# Patient Record
Sex: Male | Born: 1990 | Race: Black or African American | Hispanic: No | Marital: Single | Smoking: Current every day smoker
Health system: Southern US, Community
[De-identification: ages and names within clinical notes are randomized; demographics above are authoritative.]

## PROBLEM LIST (undated history)

## (undated) DIAGNOSIS — F209 Schizophrenia, unspecified: Secondary | ICD-10-CM

---

## 2011-12-20 ENCOUNTER — Emergency Department (HOSPITAL_BASED_OUTPATIENT_CLINIC_OR_DEPARTMENT_OTHER): Payer: PRIVATE HEALTH INSURANCE

## 2011-12-20 ENCOUNTER — Encounter (HOSPITAL_BASED_OUTPATIENT_CLINIC_OR_DEPARTMENT_OTHER): Payer: Self-pay | Admitting: Emergency Medicine

## 2011-12-20 ENCOUNTER — Emergency Department (HOSPITAL_BASED_OUTPATIENT_CLINIC_OR_DEPARTMENT_OTHER)
Admission: EM | Admit: 2011-12-20 | Discharge: 2011-12-20 | Disposition: A | Discharge status: Home / Self Care | Payer: PRIVATE HEALTH INSURANCE | Attending: Emergency Medicine | Admitting: Emergency Medicine

## 2011-12-20 DIAGNOSIS — M542 Cervicalgia: Secondary | ICD-10-CM

## 2011-12-20 DIAGNOSIS — M25519 Pain in unspecified shoulder: Secondary | ICD-10-CM

## 2011-12-20 MED ORDER — IBUPROFEN 800 MG PO TABS: 800.0000 mg | ORAL_TABLET | Freq: Three times a day (TID) | ORAL | Status: AC

## 2011-12-20 MED ORDER — HYDROCODONE-ACETAMINOPHEN 5-325 MG PO TABS: 2.0000 | ORAL_TABLET | ORAL | Status: AC | PRN

## 2011-12-20 NOTE — ED Notes (Signed)
Pt c/o LT side neck pain, radiating to LT shoulder x 2 wks; no known injury

## 2011-12-20 NOTE — ED Provider Notes (Signed)
Medical screening examination/treatment/procedure(s) were performed by non-physician practitioner and as supervising physician I was immediately available for consultation/collaboration.   Hiran Leard, MD 12/20/11 1836 

## 2011-12-20 NOTE — ED Provider Notes (Signed)
History     CSN: [id number]  Arrival date & time 12/20/11  1434   First MD Initiated Contact with Patient 12/20/11 1517      Chief Complaint  Patient presents with  . Neck Pain    (Consider location/radiation/quality/duration/timing/severity/associated sxs/prior treatment) Patient is a 21 y.o. male presenting with shoulder pain. The history is provided by the patient. No language interpreter was used.  Shoulder Pain This is a new problem. The problem occurs constantly. The problem has been gradually worsening. Pertinent negatives include no joint swelling or myalgias. Nothing aggravates the symptoms. He has tried nothing for the symptoms. The treatment provided moderate relief.  Pt complains of pain in his left shoulder and his neck for 2 weeks.  Pt reports no one injury.  Pt plays football for [employer] and has been having pain for 2 weeks.  History reviewed. No pertinent past medical history.  History reviewed. No pertinent past surgical history.  No family history on file.  History  Substance Use Topics  . Smoking status: Current Some Day Smoker    Types: Cigars  . Smokeless tobacco: Not on file  . Alcohol Use: Yes     social      Review of Systems  Musculoskeletal: Negative for myalgias and joint swelling.  Skin: Negative for wound.  All other systems reviewed and are negative.    Allergies  Review of patient's allergies indicates no known allergies.  Home Medications   Current Outpatient Rx  Name Route Sig Dispense Refill  . ACETAMINOPHEN 325 MG PO TABS Oral Take 650 mg by mouth every 6 (six) hours as needed. For pain.    . IBUPROFEN 200 MG PO TABS Oral Take 200 mg by mouth every 6 (six) hours as needed. For pain.      BP 140/71  Pulse 51  Temp 97.5 F (36.4 C) (Oral)  Resp 16  Ht 6\' 2"  (1.88 m)  Wt 235 lb (106.595 kg)  BMI 30.17 kg/m2  SpO2 100%  Physical Exam  Nursing note and vitals reviewed. Constitutional: He is oriented to person, place, and  time. He appears well-developed and well-nourished.  HENT:  Head: Normocephalic and atraumatic.  Eyes: Pupils are equal, round, and reactive to light.  Neck: Normal range of motion.  Cardiovascular: Normal rate.   Pulmonary/Chest: Effort normal.  Abdominal: Soft.  Musculoskeletal: He exhibits tenderness.       Tender left shoulder, ac joint tender   Neurological: He is alert and oriented to person, place, and time. He has normal reflexes.  Skin: Skin is warm.  Psychiatric: He has a normal mood and affect.    ED Course  Procedures (including critical care time)  Labs Reviewed - No data to display Dg Cervical Spine Complete  12/20/2011  *RADIOLOGY REPORT*  Clinical Data: Neck pain after recent football practice.  CERVICAL SPINE - COMPLETE 4+ VIEW  Comparison: None.  Findings: The lateral view images through bottom of T1. Prevertebral soft tissues are within normal limits.  Mild straightening of expected cervical lordosis.  Maintenance of vertebral body heights.  Facets are well-aligned.  Odontoid process partially obscured.  Superior portion of lateral masses also partially obscured.  C1-C2 otherwise within normal limits.  IMPRESSION:  1.  Mildly degraded evaluation of C1-C2.  Otherwise, no acute fracture or subluxation. 2. Straightening of expected cervical lordosis could be positional, due to muscular spasm, or ligamentous injury.   Original Report Authenticated By: Consuello Bossier, M.D.    Dg  Shoulder Left  12/20/2011  *RADIOLOGY REPORT*  Clinical Data: Pain after football practice.  LEFT SHOULDER - 2+ VIEW  Comparison: Cervical spine same date  Findings: No acute fracture or dislocation.  Visualized portion of the left hemithorax is normal.  The distal clavicle is minimally superiorly positioned relative to the acromion.  IMPRESSION: Minimal malalignment/displacement of distal clavicle superior to the acromion.  Cannot exclude acromioclavicular joint separation. Correlate with point  tenderness.   Original Report Authenticated By: Consuello Bossier, M.D.      1. Shoulder pain   2. Neck pain       MDM  rx for ibuprofen and hydrocodone,  Sling.   See the Orthopaedist on Tuesday for evaluation        Lonia Skinner [place], [place] 12/20/11 [id number]

## 2011-12-23 ENCOUNTER — Other Ambulatory Visit: Payer: Self-pay | Admitting: Family Medicine

## 2011-12-23 ENCOUNTER — Ambulatory Visit
Admission: RE | Admit: 2011-12-23 | Discharge: 2011-12-23 | Disposition: A | Discharge status: Home / Self Care | Payer: PRIVATE HEALTH INSURANCE | Source: Ambulatory Visit | Attending: Family Medicine | Admitting: Family Medicine

## 2011-12-23 DIAGNOSIS — M542 Cervicalgia: Secondary | ICD-10-CM

## 2013-03-07 ENCOUNTER — Other Ambulatory Visit: Payer: Self-pay | Admitting: Orthopedic Surgery

## 2013-03-07 ENCOUNTER — Ambulatory Visit
Admission: RE | Admit: 2013-03-07 | Discharge: 2013-03-07 | Disposition: A | Discharge status: Home / Self Care | Payer: PRIVATE HEALTH INSURANCE | Source: Ambulatory Visit | Attending: Orthopedic Surgery | Admitting: Orthopedic Surgery

## 2013-03-07 DIAGNOSIS — T148XXA Other injury of unspecified body region, initial encounter: Secondary | ICD-10-CM

## 2016-11-24 ENCOUNTER — Emergency Department (HOSPITAL_COMMUNITY)
Admission: EM | Admit: 2016-11-24 | Discharge: 2016-11-25 | Disposition: A | Discharge status: Home / Self Care | Payer: PRIVATE HEALTH INSURANCE | Attending: Emergency Medicine | Admitting: Emergency Medicine

## 2016-11-24 ENCOUNTER — Encounter (HOSPITAL_COMMUNITY): Payer: Self-pay | Admitting: Emergency Medicine

## 2016-11-24 DIAGNOSIS — Z046 Encounter for general psychiatric examination, requested by authority: Secondary | ICD-10-CM | POA: Diagnosis present

## 2016-11-24 DIAGNOSIS — R443 Hallucinations, unspecified: Secondary | ICD-10-CM | POA: Diagnosis not present

## 2016-11-24 DIAGNOSIS — F1099 Alcohol use, unspecified with unspecified alcohol-induced disorder: Secondary | ICD-10-CM | POA: Diagnosis not present

## 2016-11-24 DIAGNOSIS — F22 Delusional disorders: Secondary | ICD-10-CM | POA: Diagnosis present

## 2016-11-24 DIAGNOSIS — F17210 Nicotine dependence, cigarettes, uncomplicated: Secondary | ICD-10-CM | POA: Diagnosis not present

## 2016-11-24 DIAGNOSIS — R440 Auditory hallucinations: Secondary | ICD-10-CM | POA: Insufficient documentation

## 2016-11-24 LAB — CBC WITH DIFFERENTIAL/PLATELET
BASOS PCT: 0 %
Basophils Absolute: 0 10*3/uL (ref 0.0–0.1)
EOS ABS: 0 10*3/uL (ref 0.0–0.7)
Eosinophils Relative: 0 %
HEMATOCRIT: 45.3 % (ref 39.0–52.0)
Hemoglobin: 15.2 g/dL (ref 13.0–17.0)
Lymphocytes Relative: 16 %
Lymphs Abs: 1.5 10*3/uL (ref 0.7–4.0)
MCH: 28.4 pg (ref 26.0–34.0)
MCHC: 33.6 g/dL (ref 30.0–36.0)
MCV: 84.5 fL (ref 78.0–100.0)
MONO ABS: 0.8 10*3/uL (ref 0.1–1.0)
MONOS PCT: 8 %
NEUTROS ABS: 7.3 10*3/uL (ref 1.7–7.7)
Neutrophils Relative %: 76 %
Platelets: 277 10*3/uL (ref 150–400)
RBC: 5.36 MIL/uL (ref 4.22–5.81)
RDW: 15.2 % (ref 11.5–15.5)
WBC: 9.6 10*3/uL (ref 4.0–10.5)

## 2016-11-24 LAB — COMPREHENSIVE METABOLIC PANEL
ALT: 20 U/L (ref 17–63)
ANION GAP: 9 (ref 5–15)
AST: 25 U/L (ref 15–41)
Albumin: 4.5 g/dL (ref 3.5–5.0)
Alkaline Phosphatase: 68 U/L (ref 38–126)
BUN: 12 mg/dL (ref 6–20)
CO2: 26 mmol/L (ref 22–32)
Calcium: 9.7 mg/dL (ref 8.9–10.3)
Chloride: 109 mmol/L (ref 101–111)
Creatinine, Ser: 1.36 mg/dL — ABNORMAL HIGH (ref 0.61–1.24)
GFR calc non Af Amer: >60 mL/min (ref >60)
Glucose, Bld: 91 mg/dL (ref 65–99)
POTASSIUM: 3.9 mmol/L (ref 3.5–5.1)
SODIUM: 144 mmol/L (ref 135–145)
Total Bilirubin: 0.7 mg/dL (ref 0.3–1.2)
Total Protein: 8 g/dL (ref 6.5–8.1)

## 2016-11-24 LAB — ACETAMINOPHEN LEVEL: Acetaminophen (Tylenol), Serum: <10 ug/mL — ABNORMAL LOW (ref 10–30)

## 2016-11-24 LAB — RAPID URINE DRUG SCREEN, HOSP PERFORMED
AMPHETAMINES: NOT DETECTED
Barbiturates: NOT DETECTED
Benzodiazepines: NOT DETECTED
COCAINE: NOT DETECTED
OPIATES: NOT DETECTED
TETRAHYDROCANNABINOL: NOT DETECTED

## 2016-11-24 LAB — ETHANOL: Alcohol, Ethyl (B): <5 mg/dL (ref <5)

## 2016-11-24 LAB — SALICYLATE LEVEL

## 2016-11-24 MED ORDER — HYDROXYZINE HCL 25 MG PO TABS
25.0000 mg | ORAL_TABLET | Freq: Two times a day (BID) | ORAL | Status: DC
  Administered 2016-11-24: 25 mg via ORAL
  Filled 2016-11-24 (×3): qty 1

## 2016-11-24 MED ORDER — HALOPERIDOL 2 MG PO TABS
2.0000 mg | ORAL_TABLET | Freq: Two times a day (BID) | ORAL | Status: DC
Start: 2016-11-24 — End: 2016-11-25
  Administered 2016-11-24: 2 mg via ORAL
  Filled 2016-11-24 (×3): qty 1

## 2016-11-24 NOTE — ED Provider Notes (Signed)
[facility]-EMERGENCY DEPT Provider Note   CSN: [id number] Arrival date & time: 11/24/16  [id number]     History   Chief Complaint Chief Complaint  Patient presents with  . IVC    HPI Corey Burton is a 26 y.o. male.  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 He told his relative's car to drive to [place] to try out for the [employer]. Supposedly he has been taken away several times a way by security at [place] [employer].      The history is provided by the patient.  Altered Mental Status   This is a recurrent problem. The current episode started more than 2 days ago. The problem has not changed since onset.Associated symptoms include confusion. Pertinent negatives include no seizures and no hallucinations. Risk factors: unknown. His past medical history does not include seizures.    History reviewed. No pertinent past medical history.  Patient Active Problem List   Diagnosis Date Noted  . 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 No pertinent surgical history.     Home Medications    Prior to Admission medications   Not on File    Family History History reviewed. No pertinent family history.  Social History Social History  Substance Use Topics  . Smoking status: Current Some Day Smoker    Types: Cigars  . Smokeless tobacco: Never Used  . Alcohol use Yes     Comment: social     Allergies   Patient has no known allergies.   Review of Systems Review of Systems  Constitutional: Negative for appetite change and fatigue.  HENT: Negative for congestion, ear discharge and sinus pressure.   Eyes: Negative for discharge.  Respiratory: Negative for cough.   Cardiovascular: Negative for chest pain.  Gastrointestinal: Negative for abdominal pain and diarrhea.  Genitourinary: Negative for frequency and hematuria.  Musculoskeletal: Negative for back pain.    Skin: Negative for rash.  Neurological: Negative for seizures and headaches.  Psychiatric/Behavioral: Positive for confusion. Negative for hallucinations.     Physical Exam Updated Vital Signs BP (!) 157/86 (BP Location: Right Arm)   Pulse 95   Temp 98.3 F (36.8 C) (Oral)   Resp 18   Ht 6\' 1"  (1.854 m)   Wt 108.9 kg (240 lb)   SpO2 98%   BMI 31.66 kg/m   Physical Exam  Constitutional: He is oriented to person, place, and time. He appears well-developed.  HENT:  Head: Normocephalic.  Eyes: Conjunctivae and EOM are normal. No scleral icterus.  Neck: Neck supple. No thyromegaly present.  Cardiovascular: Normal rate and regular rhythm.  Exam reveals no gallop and no friction rub.   No murmur heard. Pulmonary/Chest: No stridor. He has no wheezes. He has no rales. He exhibits no tenderness.  Abdominal: He exhibits no distension. There is no tenderness. There is no rebound.  Musculoskeletal: Normal range of motion. He exhibits no edema.  Lymphadenopathy:    He has no cervical adenopathy.  Neurological: He is oriented to person, place, and time. He exhibits normal muscle tone. Coordination normal.  Skin: No rash noted. No erythema.  Psychiatric:  Patient is not suicidal and not homicidal but has delusions that he hasn't had a palpable player     ED Treatments / Results  Labs (all labs ordered are listed, but only abnormal results are displayed) Labs Reviewed  COMPREHENSIVE METABOLIC PANEL - Abnormal; Notable for  the following:       Result Value   Creatinine, Ser 1.36 (*)    All other components within normal limits  ACETAMINOPHEN LEVEL - Abnormal; Notable for the following:    Acetaminophen (Tylenol), Serum <10 (*)    All other components within normal limits  ETHANOL  RAPID URINE DRUG SCREEN, HOSP PERFORMED  CBC WITH DIFFERENTIAL/PLATELET  SALICYLATE LEVEL    EKG  EKG Interpretation None       Radiology No results found.  Procedures Procedures  (including critical care time)  Medications Ordered in ED Medications  haloperidol (HALDOL) tablet 2 mg (2 mg Oral Given 11/24/16 1125)  hydrOXYzine (ATARAX/VISTARIL) tablet 25 mg (25 mg Oral Given 11/24/16 1125)     Initial Impression / Assessment and Plan / ED Course  I have reviewed the triage vital signs and the nursing notes.  Pertinent labs & imaging results that were available during my care of the patient were reviewed by me and considered in my medical decision making (see chart for details).     Patient seen by behavioral health and they will reevaluate tomorrow Final Clinical Impressions(s) / ED Diagnoses   Final diagnoses:  Delusions [facility])    New Prescriptions New Prescriptions   No medications on file     Bethann Berkshire, MD 11/24/16 1401

## 2016-11-24 NOTE — Progress Notes (Signed)
Pt ambulatory to [facility] with a steady gait. Presents animated but anxious on initial contact. Denies SI, HI, AVH and pain when assessed. Per pt "I just need to leave, I think my father is jealous of me because I'm doing something for myself, I'm coming into some money, the [employer] are asking me to play for them". "I don't want to stay with my father becau Per pt's father during visitation; pt bought a [employer] bus pass to [place] and left home approximately 0200 this AM to attend [employer] training camp. Pt's father also stated that he took father's vehicle without permission and drove to [place] to attend [employer] training [place] and was hospitalized in [place] last month. Unit routines and IVC status explained to pt but to no avail as pt continues to demand d/c asap. Support and encouragement provided to pt. Fluids and snacks offered, tolerated well. Routine safety checks remains effective without self harm gestures or outburst to note thus far.

## 2016-11-24 NOTE — BH Assessment (Addendum)
[facility] Assessment Progress Note  Case was staffed with Akintayo MD who recommended patient be re-evaluated in the a.m.      
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
## 2016-11-24 NOTE — ED Notes (Signed)
Bed: [facility]36 Expected date:  Expected time:  Means of arrival:  Comments: Hold for hall c 

## 2016-11-24 NOTE — ED Notes (Signed)
Patient is resting quietly with eyes closed. Respirations are equal and unlabored. Skin is warm and dry. NAD. Will continue to monitor patient.

## 2016-11-24 NOTE — ED Notes (Signed)
Patient is resting comfortably. 
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
## 2016-11-24 NOTE — ED Notes (Signed)
Pt has been seen and wand by security.   Pt belongings are in cabinet assigned to bed.

## 2016-11-24 NOTE — ED Notes (Signed)
Bed: WTR5 Expected date:  Expected time:  Means of arrival:  Comments: 

## 2016-11-24 NOTE — ED Notes (Signed)
Patient eating meal at this time. Patient is calm and cooperative at this time. Patient denies SI,HI and AVH at this time. Plan of care discussed with patient. Patient voices no complaints or concerns at this time. Encouragement and support provided and safety maintain. Q 15 min safety checks remain in place and video monitoring.

## 2016-11-24 NOTE — ED Notes (Signed)
[facility] Expected date:  Expected time:  Means of arrival:  Comments: Tr 5

## 2016-11-24 NOTE — ED Notes (Signed)
Patient's aunt at the bedside. Patient calm.

## 2016-11-25 DIAGNOSIS — R443 Hallucinations, unspecified: Secondary | ICD-10-CM | POA: Diagnosis not present

## 2016-11-25 DIAGNOSIS — F1099 Alcohol use, unspecified with unspecified alcohol-induced disorder: Secondary | ICD-10-CM | POA: Diagnosis not present

## 2016-11-25 DIAGNOSIS — F22 Delusional disorders: Secondary | ICD-10-CM

## 2016-11-25 DIAGNOSIS — F17210 Nicotine dependence, cigarettes, uncomplicated: Secondary | ICD-10-CM

## 2016-11-25 NOTE — Consult Note (Signed)
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 male patient admitted with delusional thinking.  HPI:  Pt was seen by this clinician and Dr Darleene Cleaver. Pt is under IVC, placed by his father, for having fixed delusional thoughts that he is going to play football for the [employer]. Today, Pt is calm and cooperative, alert & oriented x 4, and appropriate in his demeanor. Pt denies  suicidal/homicidal ideations, denies auditory and visual hallucinations and does not appear to be responding to internal stimuli. Pt is stable and psychiatrically cleared for discharge.   Past Psychiatric History: No previous diagnoses on chart  Risk to Self: None Risk to Others: None Prior Inpatient Therapy: Prior Inpatient Therapy: No Prior Therapy Dates: NA Prior Therapy Facilty/Provider(s): NA Reason for Treatment: NA Prior Outpatient Therapy: Prior Outpatient Therapy: No Prior Therapy Dates: NA Prior Therapy Facilty/Provider(s): NA Reason for Treatment: NA Does patient have an ACCT team?: No Does patient have Intensive In-House Services?  : No Does patient have [facility] services? : No Does patient have P4CC services?: No  Past Medical History: History reviewed. No pertinent past medical history. History reviewed. No pertinent surgical history. Family History: History reviewed. No pertinent family history. Family Psychiatric  History: Unknown Social History:  History  Alcohol Use  . Yes    Comment: social     History  Drug Use No    Social History   Social History  . Marital status: Single    Spouse name: N/A  .  Number of children: N/A  . Years of education: N/A   Social History Main Topics  . Smoking status: Current Some Day Smoker    Types: Cigars  . Smokeless tobacco: Never Used  . Alcohol use Yes     Comment: social  . Drug use: No  . Sexual activity: Not Asked   Other Topics Concern  . None   Social History Narrative  . None   Additional Social History:    Allergies:  No Known Allergies  Labs:  Results for orders placed or performed during the hospital encounter of 11/24/16 (from the past 48 hour(s))  Comprehensive metabolic panel     Status: Abnormal   Collection Time: 11/24/16  8:11 AM  Result Value Ref Range   Sodium 144 135 - 145 mmol/L   Potassium 3.9 3.5 - 5.1 mmol/L   Chloride 109 101 - 111 mmol/L   CO2 26 22 - 32 mmol/L   Glucose, Bld 91 65 - 99 mg/dL   BUN 12 6 - 20 mg/dL   Creatinine, Ser 1.36 (H) 0.61 - 1.24 mg/dL   Calcium 9.7 8.9 - 10.3 mg/dL   Total Protein 8.0 6.5 - 8.1 g/dL   Albumin 4.5 3.5 - 5.0 g/dL   AST 25 15 - 41 U/L   ALT 20 17 - 63 U/L   Alkaline Phosphatase 68 38 - 126 U/L   Total Bilirubin 0.7 0.3 - 1.2 mg/dL   GFR calc non Af Amer >60 >60 mL/min   GFR calc Af Amer >60 >60 mL/min    Comment: (NOTE) The eGFR has been calculated using the CKD EPI equation.  This calculation has not been validated in all clinical situations. eGFR's persistently <60 mL/min signify possible Chronic Kidney Disease.    Anion gap 9 5 - 15  CBC with Diff     Status: None   Collection Time: 11/24/16  8:11 AM  Result Value Ref Range   WBC 9.6 4.0 - 10.5 K/uL   RBC 5.36 4.22 - 5.81 MIL/uL   Hemoglobin 15.2 13.0 - 17.0 g/dL   HCT 45.3 39.0 - 52.0 %   MCV 84.5 78.0 - 100.0 fL   MCH 28.4 26.0 - 34.0 pg   MCHC 33.6 30.0 - 36.0 g/dL   RDW 15.2 11.5 - 15.5 %   Platelets 277 150 - 400 K/uL   Neutrophils Relative % 76 %   Neutro Abs 7.3 1.7 - 7.7 K/uL   Lymphocytes Relative 16 %   Lymphs Abs 1.5 0.7 - 4.0 K/uL   Monocytes Relative 8 %   Monocytes Absolute 0.8 0.1  - 1.0 K/uL   Eosinophils Relative 0 %   Eosinophils Absolute 0.0 0.0 - 0.7 K/uL   Basophils Relative 0 %   Basophils Absolute 0.0 0.0 - 0.1 K/uL  Ethanol     Status: None   Collection Time: 11/24/16  8:12 AM  Result Value Ref Range   Alcohol, Ethyl (B) <5 <5 mg/dL    Comment:        LOWEST DETECTABLE LIMIT FOR SERUM ALCOHOL IS 5 mg/dL FOR MEDICAL PURPOSES ONLY   Acetaminophen level     Status: Abnormal   Collection Time: 11/24/16  8:12 AM  Result Value Ref Range   Acetaminophen (Tylenol), Serum <10 (L) 10 - 30 ug/mL    Comment:        THERAPEUTIC CONCENTRATIONS VARY SIGNIFICANTLY. A RANGE OF 10-30 ug/mL MAY BE AN EFFECTIVE CONCENTRATION FOR MANY PATIENTS. HOWEVER, SOME ARE BEST TREATED AT CONCENTRATIONS OUTSIDE THIS RANGE. ACETAMINOPHEN CONCENTRATIONS >150 ug/mL AT 4 HOURS AFTER INGESTION AND >50 ug/mL AT 12 HOURS AFTER INGESTION ARE OFTEN ASSOCIATED WITH TOXIC REACTIONS.   Salicylate level     Status: None   Collection Time: 11/24/16  8:12 AM  Result Value Ref Range   Salicylate Lvl <2.6 2.8 - 30.0 mg/dL  Urine rapid drug screen (hosp performed)     Status: None   Collection Time: 11/24/16  8:14 AM  Result Value Ref Range   Opiates NONE DETECTED NONE DETECTED   Cocaine NONE DETECTED NONE DETECTED   Benzodiazepines NONE DETECTED NONE DETECTED   Amphetamines NONE DETECTED NONE DETECTED   Tetrahydrocannabinol NONE DETECTED NONE DETECTED   Barbiturates NONE DETECTED NONE DETECTED    Comment:        DRUG SCREEN FOR MEDICAL PURPOSES ONLY.  IF CONFIRMATION IS NEEDED FOR ANY PURPOSE, NOTIFY LAB WITHIN 5 DAYS.        LOWEST DETECTABLE LIMITS FOR URINE DRUG SCREEN Drug Class       Cutoff (ng/mL) Amphetamine      1000 Barbiturate      200 Benzodiazepine   948 Tricyclics       546 Opiates          300 Cocaine          300 THC              50     Current Facility-Administered Medications  Medication Dose Route Frequency Provider Last Rate Last Dose  .  haloperidol (HALDOL) tablet 2 mg  2 mg Oral BID Corena Pilgrim, MD  Stopped at 11/24/16 2316  . hydrOXYzine (ATARAX/VISTARIL) tablet 25 mg  25 mg Oral BID Corena Pilgrim, MD   Stopped at 11/24/16 2316   No current outpatient prescriptions on file.    Musculoskeletal: Strength & Muscle Tone: within normal limits Gait & Station: normal Patient leans: N/A  Psychiatric Specialty Exam: Physical Exam  Constitutional: He is oriented to person, place, and time. He appears well-developed and well-nourished.  Respiratory: Effort normal.  Musculoskeletal: Normal range of motion.  Neurological: He is alert and oriented to person, place, and time.  Psychiatric: He has a normal mood and affect. His speech is normal and behavior is normal. Judgment normal. Thought content is delusional. Cognition and memory are normal.    Review of Systems  Psychiatric/Behavioral: Positive for hallucinations (Delusional disorder; grandiose type).  All other systems reviewed and are negative.   Blood pressure 124/75, pulse 60, temperature 98 F (36.7 C), temperature source Oral, resp. rate 20, height '6\' 1"'  (1.854 m), weight 108.9 kg (240 lb), SpO2 100 %.Body mass index is 31.66 kg/m.  General Appearance: Casual  Eye Contact:  Good  Speech:  Clear and Coherent and Normal Rate  Volume:  Normal  Mood:  Euthymic  Affect:  Congruent  Thought Process:  Coherent, Goal Directed and Linear  Orientation:  Full (Time, Place, and Person)  Thought Content:  Delusions  Suicidal Thoughts:  No  Homicidal Thoughts:  No  Memory:  Immediate;   Good Recent;   Fair Remote;   Fair  Judgement:  Fair  Insight:  Fair  Psychomotor Activity:  Normal  Concentration:  Concentration: Good and Attention Span: Good  Recall:  Good  Fund of Knowledge:  Good  Language:  Good  Akathisia:  No  Handed:  Right  AIMS (if indicated):     Assets:  Agricultural consultant Housing Physical  Health Resilience Social Support Transportation Vocational/Educational  ADL's:  Intact  Cognition:  WNL  Sleep:        Treatment Plan Summary: Plan Delusional Disorder: Grandiose type  Discharge Home Follow up with PCP for any new or current medical concerns Pt declined outpatient resources offered to him  Disposition: Patient does not meet criteria for psychiatric inpatient admission. Supportive therapy provided about ongoing stressors.  Ethelene Hal, NP 11/25/2016 10:58 AM  Patient seen face-to-face for psychiatric evaluation, chart reviewed and case discussed with the physician extender and developed treatment plan. Reviewed the information documented and agree with the treatment plan. Corena Pilgrim, MD

## 2016-11-25 NOTE — ED Notes (Signed)
Pt d/c home per MD order. Discharge summary reviewed with pt. Pt verbalizes understanding. Pt denies SI/HI/AVH. Pt refused to sign for personal property, property returned. Pt refused to sign e-signature at discharge. Pt ambulatory off unit with MHT and security.

## 2016-11-25 NOTE — [facility] Suicide Risk Assessment (Signed)
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 He appears well-developed and well-nourished.  Respiratory: Effort normal.  Musculoskeletal: Normal range of motion.  Neurological: He is alert and oriented to person, place, and time.  Psychiatric: He has a normal mood and affect. His speech is normal and behavior is normal. Judgment normal. Thought content is delusional. Cognition and memory are normal.   Review of Systems  Psychiatric/Behavioral: Positive for hallucinations (Delusional disorder; grandiose type).  All other systems reviewed and are negative.  Blood pressure 124/75, pulse 60, temperature 98 F (36.7 C), temperature source Oral, resp. rate 20, height 6\' 1"  (1.854 m), weight 108.9 kg (240 lb), SpO2 100 %.Body mass index is 31.66 kg/m. General Appearance: Casual Eye Contact:  Good Speech:  Clear and Coherent and Normal Rate Volume:  Normal Mood:  Euthymic Affect:  Congruent Thought Process:  Coherent, Goal Directed and Linear Orientation:  Full (Time, Place, and Person) Thought Content:  Delusions Suicidal Thoughts:  No Homicidal Thoughts:  No Memory:  Immediate;   Good Recent;   Fair Remote;   Fair Judgement:  Fair Insight:  Fair Psychomotor Activity:  Normal Concentration:  Concentration: Good and Attention Span: Good Recall:  Good Fund of Knowledge:  Good Language:  Good Akathisia:  No Handed:  Right AIMS (if indicated):    Assets:  Medical laboratory scientific officerCommunication Skills Financial  Resources/Insurance Housing Physical Health Resilience Social Support Transportation Vocational/Educational ADL's:  Intact Cognition:  WNL Sleep:     Mental Status Per Nursing Assessment::   On Admission:   Grandiose thinking  Demographic Factors:  Male and Unemployed  Loss Factors: Legal issues  Historical Factors: Impulsivity  Risk Reduction Factors:   Sense of responsibility to family, Living with another person, especially a relative and Positive social support  Continued Clinical Symptoms:  Delusional disorder; grandiose type  Cognitive Features That Contribute To Risk:  Closed-mindedness    Suicide Risk:  Minimal: No identifiable suicidal ideation.  Patients presenting with no risk factors but with morbid ruminations; may be classified as minimal risk based on the severity of the depressive symptoms    Plan Of Care/Follow-up recommendations:  Activity:  as tolerated Diet:  Heart Healthy  Laveda AbbeLaurie Britton Parks, NP 11/25/2016, 11:14 AM

## 2016-11-25 NOTE — ED Notes (Signed)
Patient refused medication stated "I'm cool I don't need it". Patient educated on the benefits of taking medications but continues to refuse. Encouragement and support provided and safety maintain. Q 15 min safety checks remain in place and video monitoring.

## 2016-11-25 NOTE — ED Notes (Signed)
Patient in resting in a deep sleep. Medication will be given when he wakes up and vital signs will be obtain. Respirations equal and unlabored, skin warm and dry. NAD. Q 15 min safety remain in place and video monitoring.

## 2016-11-25 NOTE — BH Assessment (Signed)
[facility] Assessment Progress Note  Per Thedore MinsMojeed Akintayo, MD, this pt does not require psychiatric hospitalization at this time.  Pt presents under IVC initiated by his father, and upheld by Dr Jannifer FranklinAkintayo on 11/24/2016, which he has now rescinded.  [facility] up with [facility].  This Clinical research associatewriter spoke to the pt, and he refused to have an intake appointment scheduled for him.  Discharge instructions include referral information for several area psychiatry practices for pt to follow up at his own initiative.  Pt's nurse, Morrie Sheldonshley, has been notified.  Corey Canninghomas Lynlee Stratton, MA Triage Specialist [phone]

## 2016-11-25 NOTE — Discharge Instructions (Signed)
For your behavioral health needs, you are advised to follow up with one of the following practices.  Call them at your earliest opportunity to ask about scheduling an intake appointment:       [facility]      [address]      [place], [place] [address]      [phone]       [facility]      [address]      [place], [place] [address]      [phone]       [facility]      [address], [address]      [place], [place] [address]      [phone]

## 2020-02-09 ENCOUNTER — Ambulatory Visit (HOSPITAL_COMMUNITY)
Admission: EM | Admit: 2020-02-09 | Discharge: 2020-02-09 | Disposition: A | Discharge status: Home / Self Care | Payer: No Payment, Other | Attending: Family | Admitting: Family

## 2020-02-09 ENCOUNTER — Other Ambulatory Visit: Payer: Self-pay

## 2020-02-09 DIAGNOSIS — F203 Undifferentiated schizophrenia: Secondary | ICD-10-CM | POA: Insufficient documentation

## 2020-02-09 DIAGNOSIS — F1290 Cannabis use, unspecified, uncomplicated: Secondary | ICD-10-CM | POA: Insufficient documentation

## 2020-02-09 DIAGNOSIS — Z5900 Homelessness unspecified: Secondary | ICD-10-CM | POA: Insufficient documentation

## 2020-02-09 DIAGNOSIS — Z7289 Other problems related to lifestyle: Secondary | ICD-10-CM | POA: Insufficient documentation

## 2020-02-09 MED ORDER — ARIPIPRAZOLE 2 MG PO TABS: 2.0000 mg | ORAL_TABLET | Freq: Once | ORAL | 0 refills | Status: AC

## 2020-02-09 MED ORDER — ARIPIPRAZOLE 2 MG PO TABS: 2.0000 mg | ORAL_TABLET | Freq: Once | ORAL | Status: DC

## 2020-02-09 NOTE — Discharge Instructions (Addendum)
Take all of your medications as prescribed by your mental healthcare provider.  Report any adverse effects and reactions from your medications to your outpatient provider promptly.  Do not engage in alcohol and or illegal drug use while on prescription medicines. Keep all scheduled appointments. This is to ensure that you are getting refills on time and to avoid any interruption in your medication.  If you are unable to keep an appointment call to reschedule.  Be sure to follow up with resources and follow ups given. In the event of worsening symptoms call the crisis hotline, 911, and or go to the nearest emergency department for appropriate evaluation and treatment of symptoms. Follow-up with your primary care provider for your medical issues, concerns and or health care needs.    Homeless Shelter List:     Health and safety inspector [facility])  [address] [place], [place]  Phone: [phone]     [facility]  [address] [place], [place] [address]  Phone: [phone]     [facility] (Women only)  [address] [place], [place] [address]  Phone: [phone]     [facility]  [address], [place] [address]  Phone: [phone]     [facility]:  [phone]. [address]  [place], [place] [address]  Phone: [phone]     [facility]  [address] [place], [place] [address]  (Check in at 6:00PM for placement at a local shelter)  Phone: [phone]
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
## 2020-02-09 NOTE — ED Notes (Signed)
Patient belongings in locker #15 
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
## 2020-02-09 NOTE — Progress Notes (Signed)
Corey Burton received his AVS, questions answered and he was escorted to retrieve his personal belongings. He was escorted to the lobby without incident.

## 2020-02-14 ENCOUNTER — Encounter (HOSPITAL_COMMUNITY): Payer: Self-pay

## 2020-02-14 ENCOUNTER — Ambulatory Visit (HOSPITAL_COMMUNITY)
Admission: EM | Admit: 2020-02-14 | Discharge: 2020-02-14 | Disposition: A | Discharge status: Home / Self Care | Payer: No Payment, Other | Attending: Licensed Clinical Social Worker | Admitting: Licensed Clinical Social Worker

## 2020-02-14 ENCOUNTER — Other Ambulatory Visit: Payer: Self-pay

## 2020-02-14 DIAGNOSIS — Z5901 Sheltered homelessness: Secondary | ICD-10-CM | POA: Diagnosis not present

## 2020-02-14 DIAGNOSIS — Z653 Problems related to other legal circumstances: Secondary | ICD-10-CM | POA: Diagnosis not present

## 2020-02-14 DIAGNOSIS — F203 Undifferentiated schizophrenia: Secondary | ICD-10-CM | POA: Diagnosis not present
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
## 2020-02-14 NOTE — ED Notes (Signed)
Bag locked in locker 15
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
## 2020-02-14 NOTE — Discharge Instructions (Addendum)
°  Discharge recommendations:  Patient is to take medications as prescribed. Please see information for follow-up appointment with psychiatry and therapy. Please follow up with your primary care provider for all medical related needs.  Therapy: We recommend that patient participate in individual therapy to address mental health concerns. Medications: The patient  is to contact a medical professional and/or outpatient provider to address any new side effects that develop. Patient should update outpatient providers of any new medications and/or medication changes.  Atypical antipsychotics: If you are prescribed an atypical antipsychotic, it is recommended that your height, weight, BMI, blood pressure, fasting lipid panel, and fasting blood sugar be monitored by your outpatient providers. Safety:  The patient should abstain from use of illicit substances/drugs and abuse of any medications. If symptoms worsen or do not continue to improve or if the patient becomes actively suicidal or homicidal then it is recommended that the patient return to the closet hospital emergency department, the [facility], or call 911 for further evaluation and treatment. National Suicide Prevention Lifeline 1-800-SUICIDE or 727 873 7583.

## 2020-04-11 ENCOUNTER — Other Ambulatory Visit: Payer: Self-pay

## 2020-04-11 ENCOUNTER — Emergency Department (HOSPITAL_COMMUNITY): Payer: Medicare Other

## 2020-04-11 ENCOUNTER — Inpatient Hospital Stay (HOSPITAL_COMMUNITY)
Admission: EM | Admit: 2020-04-11 | Discharge: 2020-04-18 | DRG: 493 | Payer: Medicare Other | Attending: Student | Admitting: Student

## 2020-04-11 DIAGNOSIS — Z560 Unemployment, unspecified: Secondary | ICD-10-CM

## 2020-04-11 DIAGNOSIS — S82101A Unspecified fracture of upper end of right tibia, initial encounter for closed fracture: Principal | ICD-10-CM | POA: Diagnosis present

## 2020-04-11 DIAGNOSIS — I959 Hypotension, unspecified: Secondary | ICD-10-CM | POA: Diagnosis present

## 2020-04-11 DIAGNOSIS — S61412A Laceration without foreign body of left hand, initial encounter: Secondary | ICD-10-CM | POA: Diagnosis present

## 2020-04-11 DIAGNOSIS — F209 Schizophrenia, unspecified: Secondary | ICD-10-CM | POA: Diagnosis present

## 2020-04-11 DIAGNOSIS — R45850 Homicidal ideations: Secondary | ICD-10-CM | POA: Diagnosis present

## 2020-04-11 DIAGNOSIS — F17210 Nicotine dependence, cigarettes, uncomplicated: Secondary | ICD-10-CM | POA: Diagnosis present

## 2020-04-11 DIAGNOSIS — Z79899 Other long term (current) drug therapy: Secondary | ICD-10-CM

## 2020-04-11 DIAGNOSIS — D62 Acute posthemorrhagic anemia: Secondary | ICD-10-CM | POA: Diagnosis not present

## 2020-04-11 DIAGNOSIS — Z20822 Contact with and (suspected) exposure to covid-19: Secondary | ICD-10-CM | POA: Diagnosis present

## 2020-04-11 DIAGNOSIS — W260XXA Contact with knife, initial encounter: Secondary | ICD-10-CM | POA: Diagnosis present

## 2020-04-11 DIAGNOSIS — W3400XA Accidental discharge from unspecified firearms or gun, initial encounter: Secondary | ICD-10-CM

## 2020-04-11 DIAGNOSIS — Z23 Encounter for immunization: Secondary | ICD-10-CM

## 2020-04-11 DIAGNOSIS — Z419 Encounter for procedure for purposes other than remedying health state, unspecified: Secondary | ICD-10-CM

## 2020-04-11 DIAGNOSIS — T148XXA Other injury of unspecified body region, initial encounter: Secondary | ICD-10-CM

## 2020-04-11 DIAGNOSIS — S81831A Puncture wound without foreign body, right lower leg, initial encounter: Secondary | ICD-10-CM

## 2020-04-11 DIAGNOSIS — S82209A Unspecified fracture of shaft of unspecified tibia, initial encounter for closed fracture: Secondary | ICD-10-CM

## 2020-04-11 HISTORY — DX: Schizophrenia, unspecified: F20.9

## 2020-04-11 LAB — CBC
HCT: 41.1 % (ref 39.0–52.0)
HCT: 41.2 % (ref 39.0–52.0)
Hemoglobin: 12.8 g/dL — ABNORMAL LOW (ref 13.0–17.0)
Hemoglobin: 12.9 g/dL — ABNORMAL LOW (ref 13.0–17.0)
MCH: 28.6 pg (ref 26.0–34.0)
MCH: 28.7 pg (ref 26.0–34.0)
MCHC: 31.1 g/dL (ref 30.0–36.0)
MCHC: 31.4 g/dL (ref 30.0–36.0)
MCV: 91.3 fL (ref 80.0–100.0)
MCV: 92.2 fL (ref 80.0–100.0)
Platelets: 203 10*3/uL (ref 150–400)
Platelets: 222 10*3/uL (ref 150–400)
RBC: 4.47 MIL/uL (ref 4.22–5.81)
RBC: 4.5 MIL/uL (ref 4.22–5.81)
RDW: 13.3 % (ref 11.5–15.5)
RDW: 13.3 % (ref 11.5–15.5)
WBC: 11.1 10*3/uL — ABNORMAL HIGH (ref 4.0–10.5)
WBC: 24.1 10*3/uL — ABNORMAL HIGH (ref 4.0–10.5)
nRBC: 0 % (ref 0.0–0.2)
nRBC: 0 % (ref 0.0–0.2)

## 2020-04-11 LAB — COMPREHENSIVE METABOLIC PANEL
ALT: 17 U/L (ref 0–44)
AST: 28 U/L (ref 15–41)
Albumin: 2.9 g/dL — ABNORMAL LOW (ref 3.5–5.0)
Alkaline Phosphatase: 36 U/L — ABNORMAL LOW (ref 38–126)
Anion gap: 12 (ref 5–15)
BUN: 9 mg/dL (ref 6–20)
CO2: 17 mmol/L — ABNORMAL LOW (ref 22–32)
Calcium: 7.6 mg/dL — ABNORMAL LOW (ref 8.9–10.3)
Chloride: 105 mmol/L (ref 98–111)
Creatinine, Ser: 1.53 mg/dL — ABNORMAL HIGH (ref 0.61–1.24)
GFR, Estimated: >60 mL/min (ref >60)
Glucose, Bld: 199 mg/dL — ABNORMAL HIGH (ref 70–99)
Potassium: 4.7 mmol/L (ref 3.5–5.1)
Sodium: 134 mmol/L — ABNORMAL LOW (ref 135–145)
Total Bilirubin: 0.6 mg/dL (ref 0.3–1.2)
Total Protein: 4.7 g/dL — ABNORMAL LOW (ref 6.5–8.1)

## 2020-04-11 LAB — SAMPLE TO BLOOD BANK

## 2020-04-11 LAB — CK TOTAL AND CKMB (NOT AT ARMC)
CK, MB: 1.2 ng/mL (ref 0.5–5.0)
Relative Index: 0.7 (ref 0.0–2.5)
Total CK: 180 U/L (ref 49–397)

## 2020-04-11 LAB — RESP PANEL BY RT-PCR (FLU A&B, COVID) ARPGX2
Influenza A by PCR: NEGATIVE
Influenza B by PCR: NEGATIVE
SARS Coronavirus 2 by RT PCR: NEGATIVE

## 2020-04-11 LAB — ETHANOL: Alcohol, Ethyl (B): <10 mg/dL (ref <10)

## 2020-04-11 MED ORDER — METOCLOPRAMIDE HCL 10 MG PO TABS
5.0000 mg | ORAL_TABLET | Freq: Three times a day (TID) | ORAL | Status: DC | PRN
  Filled 2020-04-11: qty 1

## 2020-04-11 MED ORDER — METOCLOPRAMIDE HCL 5 MG/ML IJ SOLN: 5.0000 mg | Freq: Three times a day (TID) | INTRAMUSCULAR | Status: DC | PRN

## 2020-04-11 MED ORDER — ENOXAPARIN SODIUM 40 MG/0.4ML ~~LOC~~ SOLN: 40.0000 mg | Freq: Every day | SUBCUTANEOUS | Status: DC

## 2020-04-11 MED ORDER — HYDROMORPHONE HCL 1 MG/ML IJ SOLN: 1.0000 mg | INTRAMUSCULAR | Status: DC | PRN

## 2020-04-11 MED ORDER — METHOCARBAMOL 1000 MG/10ML IJ SOLN: 500.0000 mg | Freq: Four times a day (QID) | INTRAVENOUS | Status: DC | PRN

## 2020-04-11 MED ORDER — ONDANSETRON HCL 4 MG PO TABS: 4.0000 mg | ORAL_TABLET | Freq: Four times a day (QID) | ORAL | Status: DC | PRN

## 2020-04-11 MED ORDER — OXYCODONE HCL 5 MG PO TABS
5.0000 mg | ORAL_TABLET | ORAL | Status: DC | PRN
  Administered 2020-04-12 – 2020-04-18 (×3): 10 mg via ORAL
  Filled 2020-04-11 (×4): qty 2

## 2020-04-11 MED ORDER — SODIUM CHLORIDE 0.9 % IV SOLN
INTRAVENOUS | Status: AC | PRN
  Administered 2020-04-11: 1000 mL via INTRAVENOUS

## 2020-04-11 MED ORDER — METHOCARBAMOL 500 MG PO TABS
500.0000 mg | ORAL_TABLET | Freq: Four times a day (QID) | ORAL | Status: DC | PRN
  Administered 2020-04-13 – 2020-04-14 (×2): 500 mg via ORAL
  Filled 2020-04-11 (×2): qty 1

## 2020-04-11 MED ORDER — ACETAMINOPHEN 500 MG PO TABS
1000.0000 mg | ORAL_TABLET | Freq: Three times a day (TID) | ORAL | Status: DC
  Administered 2020-04-12 – 2020-04-18 (×13): 1000 mg via ORAL
  Filled 2020-04-11 (×18): qty 2

## 2020-04-11 MED ORDER — IOHEXOL 350 MG/ML SOLN
100.0000 mL | Freq: Once | INTRAVENOUS | Status: AC | PRN
  Administered 2020-04-11: 100 mL via INTRAVENOUS

## 2020-04-11 MED ORDER — LIDOCAINE HCL (PF) 1 % IJ SOLN
5.0000 mL | Freq: Once | INTRAMUSCULAR | Status: AC
  Administered 2020-04-11: 5 mL
  Filled 2020-04-11: qty 5

## 2020-04-11 MED ORDER — ACETAMINOPHEN 325 MG PO TABS: 325.0000 mg | ORAL_TABLET | Freq: Four times a day (QID) | ORAL | Status: DC | PRN

## 2020-04-11 MED ORDER — ONDANSETRON HCL 4 MG/2ML IJ SOLN: 4.0000 mg | Freq: Four times a day (QID) | INTRAMUSCULAR | Status: DC | PRN

## 2020-04-11 MED ORDER — POTASSIUM CHLORIDE IN NACL 20-0.9 MEQ/L-% IV SOLN
INTRAVENOUS | Status: DC
  Filled 2020-04-11 (×10): qty 1000

## 2020-04-11 MED ORDER — TETANUS-DIPHTH-ACELL PERTUSSIS 5-2.5-18.5 LF-MCG/0.5 IM SUSY
0.5000 mL | PREFILLED_SYRINGE | Freq: Once | INTRAMUSCULAR | Status: AC
  Administered 2020-04-11: 0.5 mL via INTRAMUSCULAR
  Filled 2020-04-11: qty 0.5

## 2020-04-11 MED ORDER — OXYCODONE HCL 5 MG PO TABS
10.0000 mg | ORAL_TABLET | ORAL | Status: DC | PRN
  Administered 2020-04-13: 15 mg via ORAL
  Administered 2020-04-14: 10 mg via ORAL
  Filled 2020-04-11: qty 3

## 2020-04-11 MED ORDER — DOCUSATE SODIUM 100 MG PO CAPS
100.0000 mg | ORAL_CAPSULE | Freq: Two times a day (BID) | ORAL | Status: DC
  Administered 2020-04-12 – 2020-04-18 (×10): 100 mg via ORAL
  Filled 2020-04-11 (×12): qty 1

## 2020-04-11 MED ORDER — CEFAZOLIN SODIUM-DEXTROSE 2-4 GM/100ML-% IV SOLN
2.0000 g | Freq: Once | INTRAVENOUS | Status: AC
  Administered 2020-04-11: 2 g via INTRAVENOUS
  Filled 2020-04-11: qty 100
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
## 2020-04-11 NOTE — ED Notes (Signed)
Called ortho for long leg splint placement

## 2020-04-11 NOTE — Progress Notes (Signed)
Orthopedic Tech Progress Note Patient Details:  Brennen Camper 06-28-1990 [id number]  Ortho Devices Type of Ortho Device: Long leg splint Ortho Device/Splint Location: Right Leg Ortho Device/Splint Interventions: Application   Post Interventions Patient Tolerated: Well   Genelle Bal Keiley Levey 04/11/2020, 11:22 PM

## 2020-04-11 NOTE — ED Notes (Signed)
Paged Attending for RN °

## 2020-04-11 NOTE — ED Notes (Addendum)
BP dropped to 85/53 started second bolus. BP increased to 109/93. Pt was no acute distress. NSR 80s. Dr. Derrell Lolling and ortho Dr Jena Gauss informed

## 2020-04-11 NOTE — ED Provider Notes (Signed)
  FROM of the finger index without strength deficit to suggest tendon damage. Neurovascularly intact.   Multiple lacerations in and around the base of the left index finger, repaired as follows for Dr. Renaye Rakers.     LACERATION REPAIR Performed by: Arnoldo Hooker Authorized by: Arnoldo Hooker Consent: Verbal consent obtained. Risks and benefits: risks, benefits and alternatives were discussed Consent given by: patient Patient identity confirmed: provided demographic data Prepped and Draped in normal sterile fashion Wound explored  Laceration Location: left hand, base of 2nd finger  Laceration Length: 4 cm, stellate  No Foreign Bodies seen or palpated  Anesthesia: local infiltration  Local anesthetic: lidocaine 1% w/o epinephrine  Anesthetic total: 2 ml  Irrigation method: syringe Amount of cleaning: standard  Skin closure: 4-0 prolene  Number of sutures: 8  Technique: simple interrupted  Patient tolerance: Patient tolerated the procedure well with no immediate complications.  LACERATION REPAIR Performed by: Arnoldo Hooker Authorized by: Arnoldo Hooker Consent: Verbal consent obtained. Risks and benefits: risks, benefits and alternatives were discussed Consent given by: patient Patient identity confirmed: provided demographic data Prepped and Draped in normal sterile fashion Wound explored  Laceration Location: dorsal base left index finger  Laceration Length: 1.5 cm  No Foreign Bodies seen or palpated  Anesthesia: local infiltration  Local anesthetic: lidocaine 1% w/o epinephrine  Anesthetic total: 1 ml  Irrigation method: syringe Amount of cleaning: standard  Skin closure: 4-0 prolene  Number of sutures: 3  Technique: simple interrupted  Patient tolerance: Patient tolerated the procedure well with no immediate complications.   LACERATION REPAIR Performed by: Arnoldo Hooker Authorized by: Arnoldo Hooker Consent: Verbal consent  obtained. Risks and benefits: risks, benefits and alternatives were discussed Consent given by: patient Patient identity confirmed: provided demographic data Prepped and Draped in normal sterile fashion Wound explored  Laceration Location: lateral base left index finger  Laceration Length: 2cm  No Foreign Bodies seen or palpated  Anesthesia: local infiltration  Local anesthetic: lidocaine 1% w/o epinephrine  Anesthetic total: 1 ml  Irrigation method: syringe Amount of cleaning: standard  Skin closure: 4-0 prolene  Number of sutures: 4  Technique: simple interrupted  Patient tolerance: Patient tolerated the procedure well with no immediate complications.    Elpidio Anis, PA-C 04/12/20 0038    Terald Sleeper, MD 04/12/20 [phone]
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
## 2020-04-11 NOTE — Progress Notes (Signed)
Orthopedic Tech Progress Note Patient Details:  Corey Burton 04/21/1875 [id number] Level 1 Trauma  Patient ID: Corey Burton, male   DOB: 04/21/1875, 29 y.o.   MRN: [id number]   Smitty Pluck 04/11/2020, 10:12 PM

## 2020-04-11 NOTE — ED Notes (Signed)
Paged Trauma Derrell Lolling MD for RN
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
## 2020-04-12 ENCOUNTER — Inpatient Hospital Stay (HOSPITAL_COMMUNITY): Payer: Medicare Other | Admitting: Certified Registered"

## 2020-04-12 ENCOUNTER — Encounter (HOSPITAL_COMMUNITY): Admission: EM | Payer: Self-pay | Source: Home / Self Care | Attending: Student

## 2020-04-12 ENCOUNTER — Inpatient Hospital Stay (HOSPITAL_COMMUNITY): Payer: Medicare Other

## 2020-04-12 HISTORY — PX: TIBIA IM NAIL INSERTION: SHX2516

## 2020-04-12 LAB — COMPREHENSIVE METABOLIC PANEL
ALT: 20 U/L (ref 0–44)
AST: 21 U/L (ref 15–41)
Albumin: 3 g/dL — ABNORMAL LOW (ref 3.5–5.0)
Alkaline Phosphatase: 38 U/L (ref 38–126)
Anion gap: 9 (ref 5–15)
BUN: 8 mg/dL (ref 6–20)
CO2: 18 mmol/L — ABNORMAL LOW (ref 22–32)
Calcium: 8 mg/dL — ABNORMAL LOW (ref 8.9–10.3)
Chloride: 110 mmol/L (ref 98–111)
Creatinine, Ser: 1.2 mg/dL (ref 0.61–1.24)
GFR, Estimated: >60 mL/min (ref >60)
Glucose, Bld: 119 mg/dL — ABNORMAL HIGH (ref 70–99)
Potassium: 4 mmol/L (ref 3.5–5.1)
Sodium: 137 mmol/L (ref 135–145)
Total Bilirubin: 0.7 mg/dL (ref 0.3–1.2)
Total Protein: 5.7 g/dL — ABNORMAL LOW (ref 6.5–8.1)

## 2020-04-12 LAB — VITAMIN D 25 HYDROXY (VIT D DEFICIENCY, FRACTURES): Vit D, 25-Hydroxy: 24.67 ng/mL — ABNORMAL LOW (ref 30–100)

## 2020-04-12 LAB — PROTIME-INR
INR: 1.2 (ref 0.8–1.2)
Prothrombin Time: 14.4 seconds (ref 11.4–15.2)

## 2020-04-12 LAB — CBC
HCT: 37.3 % — ABNORMAL LOW (ref 39.0–52.0)
Hemoglobin: 12.3 g/dL — ABNORMAL LOW (ref 13.0–17.0)
MCH: 29.1 pg (ref 26.0–34.0)
MCHC: 33 g/dL (ref 30.0–36.0)
MCV: 88.2 fL (ref 80.0–100.0)
Platelets: 238 10*3/uL (ref 150–400)
RBC: 4.23 MIL/uL (ref 4.22–5.81)
RDW: 13.5 % (ref 11.5–15.5)
WBC: 17.9 10*3/uL — ABNORMAL HIGH (ref 4.0–10.5)
nRBC: 0 % (ref 0.0–0.2)

## 2020-04-12 LAB — HIV ANTIBODY (ROUTINE TESTING W REFLEX): HIV Screen 4th Generation wRfx: NONREACTIVE

## 2020-04-12 LAB — CREATININE, SERUM
Creatinine, Ser: 1.34 mg/dL — ABNORMAL HIGH (ref 0.61–1.24)
GFR, Estimated: >60 mL/min (ref >60)

## 2020-04-12 LAB — LACTIC ACID, PLASMA: Lactic Acid, Venous: 7.1 mmol/L (ref 0.5–1.9)

## 2020-04-12 SURGERY — INSERTION, INTRAMEDULLARY ROD, TIBIA
Anesthesia: General | Laterality: Right

## 2020-04-12 MED ORDER — DEXAMETHASONE SODIUM PHOSPHATE 10 MG/ML IJ SOLN
INTRAMUSCULAR | Status: AC
  Filled 2020-04-12: qty 1

## 2020-04-12 MED ORDER — KETOROLAC TROMETHAMINE 30 MG/ML IJ SOLN
INTRAMUSCULAR | Status: DC | PRN
  Administered 2020-04-12: 30 mg via INTRAVENOUS

## 2020-04-12 MED ORDER — DEXMEDETOMIDINE (PRECEDEX) IN NS 20 MCG/5ML (4 MCG/ML) IV SYRINGE
PREFILLED_SYRINGE | INTRAVENOUS | Status: AC
  Filled 2020-04-12: qty 5

## 2020-04-12 MED ORDER — SUCCINYLCHOLINE CHLORIDE 200 MG/10ML IV SOSY
PREFILLED_SYRINGE | INTRAVENOUS | Status: DC | PRN
  Administered 2020-04-12: 120 mg via INTRAVENOUS

## 2020-04-12 MED ORDER — MIDAZOLAM HCL 5 MG/5ML IJ SOLN
INTRAMUSCULAR | Status: DC | PRN
  Administered 2020-04-12: 2 mg via INTRAVENOUS

## 2020-04-12 MED ORDER — 0.9 % SODIUM CHLORIDE (POUR BTL) OPTIME
TOPICAL | Status: DC | PRN
  Administered 2020-04-12: 1000 mL

## 2020-04-12 MED ORDER — FENTANYL CITRATE (PF) 100 MCG/2ML IJ SOLN
INTRAMUSCULAR | Status: DC | PRN
  Administered 2020-04-12 (×7): 50 ug via INTRAVENOUS

## 2020-04-12 MED ORDER — SUGAMMADEX SODIUM 200 MG/2ML IV SOLN
INTRAVENOUS | Status: DC | PRN
  Administered 2020-04-12: 200 mg via INTRAVENOUS

## 2020-04-12 MED ORDER — KETOROLAC TROMETHAMINE 30 MG/ML IJ SOLN
INTRAMUSCULAR | Status: AC
  Filled 2020-04-12: qty 1

## 2020-04-12 MED ORDER — AMISULPRIDE (ANTIEMETIC) 5 MG/2ML IV SOLN: 10.0000 mg | Freq: Once | INTRAVENOUS | Status: DC | PRN

## 2020-04-12 MED ORDER — DEXMEDETOMIDINE (PRECEDEX) IN NS 20 MCG/5ML (4 MCG/ML) IV SYRINGE
PREFILLED_SYRINGE | INTRAVENOUS | Status: DC | PRN
  Administered 2020-04-12 (×5): 4 ug via INTRAVENOUS
  Administered 2020-04-12: 8 ug via INTRAVENOUS

## 2020-04-12 MED ORDER — ASPIRIN EC 325 MG PO TBEC: 325.0000 mg | DELAYED_RELEASE_TABLET | Freq: Two times a day (BID) | ORAL | 0 refills | Status: AC

## 2020-04-12 MED ORDER — LACTATED RINGERS IV SOLN: INTRAVENOUS | Status: DC

## 2020-04-12 MED ORDER — CEFAZOLIN SODIUM-DEXTROSE 2-4 GM/100ML-% IV SOLN
2.0000 g | INTRAVENOUS | Status: AC
  Administered 2020-04-12: 2 g via INTRAVENOUS
  Filled 2020-04-12: qty 100

## 2020-04-12 MED ORDER — CHLORHEXIDINE GLUCONATE 0.12 % MT SOLN: 15.0000 mL | Freq: Once | OROMUCOSAL | Status: AC

## 2020-04-12 MED ORDER — ONDANSETRON HCL 4 MG/2ML IJ SOLN
INTRAMUSCULAR | Status: DC | PRN
  Administered 2020-04-12: 4 mg via INTRAVENOUS

## 2020-04-12 MED ORDER — ASPIRIN EC 325 MG PO TBEC: 325.0000 mg | DELAYED_RELEASE_TABLET | Freq: Two times a day (BID) | ORAL | 0 refills | Status: DC

## 2020-04-12 MED ORDER — CEFAZOLIN SODIUM-DEXTROSE 2-4 GM/100ML-% IV SOLN
2.0000 g | Freq: Three times a day (TID) | INTRAVENOUS | Status: AC
  Administered 2020-04-12 – 2020-04-13 (×3): 2 g via INTRAVENOUS
  Filled 2020-04-12 (×5): qty 100

## 2020-04-12 MED ORDER — HYDROMORPHONE HCL 1 MG/ML IJ SOLN
INTRAMUSCULAR | Status: AC
  Filled 2020-04-12: qty 0.5

## 2020-04-12 MED ORDER — ONDANSETRON HCL 4 MG/2ML IJ SOLN
INTRAMUSCULAR | Status: AC
  Filled 2020-04-12: qty 2

## 2020-04-12 MED ORDER — PROPOFOL 10 MG/ML IV BOLUS
INTRAVENOUS | Status: AC
  Filled 2020-04-12: qty 20

## 2020-04-12 MED ORDER — CHLORHEXIDINE GLUCONATE 0.12 % MT SOLN
OROMUCOSAL | Status: AC
  Administered 2020-04-12: 15 mL via OROMUCOSAL
  Filled 2020-04-12: qty 15

## 2020-04-12 MED ORDER — ROCURONIUM BROMIDE 100 MG/10ML IV SOLN
INTRAVENOUS | Status: DC | PRN
  Administered 2020-04-12: 60 mg via INTRAVENOUS
  Administered 2020-04-12: 10 mg via INTRAVENOUS

## 2020-04-12 MED ORDER — FENTANYL CITRATE (PF) 250 MCG/5ML IJ SOLN
INTRAMUSCULAR | Status: AC
  Filled 2020-04-12: qty 5

## 2020-04-12 MED ORDER — ENOXAPARIN SODIUM 40 MG/0.4ML ~~LOC~~ SOLN
40.0000 mg | SUBCUTANEOUS | Status: DC
  Administered 2020-04-13 – 2020-04-18 (×6): 40 mg via SUBCUTANEOUS
  Filled 2020-04-12 (×6): qty 0.4

## 2020-04-12 MED ORDER — ACETAMINOPHEN 500 MG PO TABS: 1000.0000 mg | ORAL_TABLET | Freq: Three times a day (TID) | ORAL | 0 refills | Status: AC | PRN

## 2020-04-12 MED ORDER — ACETAMINOPHEN 500 MG PO TABS: 1000.0000 mg | ORAL_TABLET | Freq: Three times a day (TID) | ORAL | 0 refills | Status: DC | PRN

## 2020-04-12 MED ORDER — OXYCODONE HCL 5 MG PO TABS: 5.0000 mg | ORAL_TABLET | Freq: Once | ORAL | Status: DC | PRN

## 2020-04-12 MED ORDER — HYDROMORPHONE HCL 1 MG/ML IJ SOLN
INTRAMUSCULAR | Status: DC | PRN
  Administered 2020-04-12 (×2): .5 mg via INTRAVENOUS

## 2020-04-12 MED ORDER — OXYCODONE HCL 5 MG PO TABS: 5.0000 mg | ORAL_TABLET | ORAL | 0 refills | Status: DC | PRN

## 2020-04-12 MED ORDER — PROPOFOL 10 MG/ML IV BOLUS
INTRAVENOUS | Status: DC | PRN
  Administered 2020-04-12: 200 mg via INTRAVENOUS

## 2020-04-12 MED ORDER — HYDROMORPHONE HCL 1 MG/ML IJ SOLN
0.2500 mg | INTRAMUSCULAR | Status: DC | PRN
Start: 2020-04-12 — End: 2020-04-12

## 2020-04-12 MED ORDER — OXYCODONE HCL 5 MG PO TABS: 5.0000 mg | ORAL_TABLET | ORAL | 0 refills | Status: AC | PRN

## 2020-04-12 MED ORDER — DEXAMETHASONE SODIUM PHOSPHATE 10 MG/ML IJ SOLN
INTRAMUSCULAR | Status: DC | PRN
  Administered 2020-04-12: 4 mg via INTRAVENOUS

## 2020-04-12 MED ORDER — OXYCODONE HCL 5 MG PO TABS
5.0000 mg | ORAL_TABLET | ORAL | 0 refills | Status: DC | PRN
Start: 2020-04-12 — End: 2020-04-12

## 2020-04-12 MED ORDER — VANCOMYCIN HCL 1000 MG IV SOLR
INTRAVENOUS | Status: AC
  Filled 2020-04-12: qty 1000

## 2020-04-12 MED ORDER — OXYCODONE HCL 5 MG/5ML PO SOLN: 5.0000 mg | Freq: Once | ORAL | Status: DC | PRN

## 2020-04-12 MED ORDER — ONDANSETRON HCL 4 MG/2ML IJ SOLN: 4.0000 mg | Freq: Once | INTRAMUSCULAR | Status: DC | PRN

## 2020-04-12 MED ORDER — MIDAZOLAM HCL 2 MG/2ML IJ SOLN
INTRAMUSCULAR | Status: AC
  Filled 2020-04-12: qty 2

## 2020-04-12 MED ORDER — VANCOMYCIN HCL 1000 MG IV SOLR
INTRAVENOUS | Status: DC | PRN
  Administered 2020-04-12: 1000 mg

## 2020-04-12 MED ORDER — LIDOCAINE 2% (20 MG/ML) 5 ML SYRINGE
INTRAMUSCULAR | Status: DC | PRN
  Administered 2020-04-12: 100 mg via INTRAVENOUS

## 2020-04-12 SURGICAL SUPPLY — 54 items
BIT DRILL CALIBRATED 4.2 (BIT) ×1 IMPLANT
BIT DRILL SHORT 4.2 (BIT) ×2 IMPLANT
BLADE SURG 10 STRL SS (BLADE) ×4 IMPLANT
BNDG COHESIVE 4X5 TAN STRL (GAUZE/BANDAGES/DRESSINGS) ×2 IMPLANT
BNDG ELASTIC 4X5.8 VLCR STR LF (GAUZE/BANDAGES/DRESSINGS) ×2 IMPLANT
BNDG ELASTIC 6X5.8 VLCR STR LF (GAUZE/BANDAGES/DRESSINGS) ×2 IMPLANT
BNDG GAUZE ELAST 4 BULKY (GAUZE/BANDAGES/DRESSINGS) ×2 IMPLANT
BRUSH SCRUB EZ PLAIN DRY (MISCELLANEOUS) ×4 IMPLANT
CHLORAPREP W/TINT 26 (MISCELLANEOUS) ×2 IMPLANT
COVER SURGICAL LIGHT HANDLE (MISCELLANEOUS) ×4 IMPLANT
COVER WAND RF STERILE (DRAPES) ×2 IMPLANT
DRAPE C-ARM 42X72 X-RAY (DRAPES) ×2 IMPLANT
DRAPE C-ARMOR (DRAPES) ×2 IMPLANT
DRAPE HALF SHEET 40X57 (DRAPES) ×4 IMPLANT
DRAPE IMP U-DRAPE 54X76 (DRAPES) ×4 IMPLANT
DRAPE INCISE IOBAN 66X45 STRL (DRAPES) ×2 IMPLANT
DRAPE ORTHO SPLIT 77X108 STRL (DRAPES) ×2
DRAPE SURG ORHT 6 SPLT 77X108 (DRAPES) ×2 IMPLANT
DRAPE U-SHAPE 47X51 STRL (DRAPES) ×2 IMPLANT
DRILL BIT CALIBRATED 4.2 (BIT) ×2
DRILL BIT SHORT 4.2 (BIT) ×2
DRSG ADAPTIC 3X8 NADH LF (GAUZE/BANDAGES/DRESSINGS) ×2 IMPLANT
ELECT REM PT RETURN 9FT ADLT (ELECTROSURGICAL) ×2
ELECTRODE REM PT RTRN 9FT ADLT (ELECTROSURGICAL) ×1 IMPLANT
GAUZE SPONGE 4X4 12PLY STRL (GAUZE/BANDAGES/DRESSINGS) ×2 IMPLANT
GLOVE BIO SURGEON STRL SZ 6.5 (GLOVE) ×6 IMPLANT
GLOVE BIO SURGEON STRL SZ7.5 (GLOVE) ×8 IMPLANT
GLOVE BIOGEL PI IND STRL 6.5 (GLOVE) ×1 IMPLANT
GLOVE BIOGEL PI IND STRL 7.5 (GLOVE) ×1 IMPLANT
GLOVE BIOGEL PI INDICATOR 6.5 (GLOVE) ×1
GLOVE BIOGEL PI INDICATOR 7.5 (GLOVE) ×1
GOWN STRL REUS W/ TWL LRG LVL3 (GOWN DISPOSABLE) ×2 IMPLANT
GOWN STRL REUS W/TWL LRG LVL3 (GOWN DISPOSABLE) ×2
GUIDEWIRE 3.2X400 (WIRE) ×2 IMPLANT
KIT BASIN OR (CUSTOM PROCEDURE TRAY) ×2 IMPLANT
KIT TURNOVER KIT B (KITS) ×2 IMPLANT
NAIL TIB 10X405 (Nail) ×2 IMPLANT
PACK TOTAL JOINT (CUSTOM PROCEDURE TRAY) ×2 IMPLANT
PAD ARMBOARD 7.5X6 YLW CONV (MISCELLANEOUS) ×4 IMPLANT
REAMER ROD DEEP FLUTE 2.5X950 (INSTRUMENTS) ×2 IMPLANT
SCREW LOCK STAR 5X38 (Screw) ×2 IMPLANT
SCREW LOCK STAR 5X42 (Screw) ×2 IMPLANT
SCREW LOCK STAR 5X44 (Screw) ×2 IMPLANT
SCREW LOCK STAR 5X70 (Screw) ×2 IMPLANT
SCREW LOCK STAR 5X80 (Screw) ×2 IMPLANT
STAPLER VISISTAT 35W (STAPLE) ×2 IMPLANT
SUT MNCRL AB 3-0 PS2 18 (SUTURE) ×2 IMPLANT
SUT VIC AB 0 CT1 27 (SUTURE) ×1
SUT VIC AB 0 CT1 27XBRD ANBCTR (SUTURE) ×1 IMPLANT
SUT VIC AB 2-0 CT1 27 (SUTURE) ×1
SUT VIC AB 2-0 CT1 TAPERPNT 27 (SUTURE) ×1 IMPLANT
TOWEL GREEN STERILE (TOWEL DISPOSABLE) ×4 IMPLANT
TOWEL GREEN STERILE FF (TOWEL DISPOSABLE) ×2 IMPLANT
YANKAUER SUCT BULB TIP NO VENT (SUCTIONS) ×2 IMPLANT
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
## 2020-04-12 NOTE — Transfer of Care (Signed)
Immediate Anesthesia Transfer of Care Note  Patient: Corey Burton  Procedure(s) Performed: INTRAMEDULLARY (IM) NAIL TIBIAL (Right )  Patient Location: PACU  Anesthesia Type:General  Level of Consciousness: awake and patient cooperative  Airway & Oxygen Therapy: Patient Spontanous Breathing and Patient connected to face mask oxygen  Post-op Assessment: Report given to RN and Post -op Vital signs reviewed and stable  Post vital signs: Reviewed and stable  Last Vitals:  Vitals Value Taken Time  BP 143/84 04/12/20 1220  Temp    Pulse 87 04/12/20 1228  Resp 12 04/12/20 1228  SpO2 98 % 04/12/20 1228  Vitals shown include unvalidated device data.  Last Pain:  Vitals:   04/12/20 0927  TempSrc:   PainSc: 7          Complications: No complications documented.
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
## 2020-04-12 NOTE — Discharge Instructions (Signed)
Orthopaedic Trauma Service Discharge Instructions   General Discharge Instructions  WEIGHT BEARING STATUS: Weightbearing as tolerated right lower extremity in CAM walking boot  RANGE OF MOTION/ACTIVITY: Ok for knee and ankle motion as tolerated.   Wound Care: Incisions can be left open to air if there is no drainage. Leave white/yellow steri-strips in place. If incision continues to have drainage, follow wound care instructions below. Okay to shower if no drainage from incisions.  DVT/PE prophylaxis: Aspirin 325 mg twice daily x 30 days  Diet: as you were eating previously.  Can use over the counter stool softeners and bowel preparations, such as Miralax, to help with bowel movements.  Narcotics can be constipating.  Be sure to drink plenty of fluids  PAIN MEDICATION USE AND EXPECTATIONS  You have likely been given narcotic medications to help control your pain.  After a traumatic event that results in an fracture (broken bone) with or without surgery, it is ok to use narcotic pain medications to help control one's pain.  We understand that everyone responds to pain differently and each individual patient will be evaluated on a regular basis for the continued need for narcotic medications. Ideally, narcotic medication use should last no more than 6-8 weeks (coinciding with fracture healing).   As a patient it is your responsibility as well to monitor narcotic medication use and report the amount and frequency you use these medications when you come to your office visit.   We would also advise that if you are using narcotic medications, you should take a dose prior to therapy to maximize you participation.  IF YOU ARE ON NARCOTIC MEDICATIONS IT IS NOT PERMISSIBLE TO OPERATE A MOTOR VEHICLE (MOTORCYCLE/CAR/TRUCK/MOPED) OR HEAVY MACHINERY DO NOT MIX NARCOTICS WITH OTHER CNS (CENTRAL NERVOUS SYSTEM) DEPRESSANTS SUCH AS ALCOHOL   STOP SMOKING OR USING NICOTINE PRODUCTS!!!!  As discussed  nicotine severely impairs your body's ability to heal surgical and traumatic wounds but also impairs bone healing.  Wounds and bone heal by forming microscopic blood vessels (angiogenesis) and nicotine is a vasoconstrictor (essentially, shrinks blood vessels).  Therefore, if vasoconstriction occurs to these microscopic blood vessels they essentially disappear and are unable to deliver necessary nutrients to the healing tissue.  This is one modifiable factor that you can do to dramatically increase your chances of healing your injury.    (This means no smoking, no nicotine gum, patches, etc)  DO NOT USE NONSTEROIDAL ANTI-INFLAMMATORY DRUGS (NSAID'S)  Using products such as Advil (ibuprofen), Aleve (naproxen), Motrin (ibuprofen) for additional pain control during fracture healing can delay and/or prevent the healing response.  If you would like to take over the counter (OTC) medication, Tylenol (acetaminophen) is ok.  However, some narcotic medications that are given for pain control contain acetaminophen as well. Therefore, you should not exceed more than 4000 mg of tylenol in a day if you do not have liver disease.  Also note that there are may OTC medicines, such as cold medicines and allergy medicines that my contain tylenol as well.  If you have any questions about medications and/or interactions please ask your doctor/PA or your pharmacist.      ICE AND ELEVATE INJURED/OPERATIVE EXTREMITY  Using ice and elevating the injured extremity above your heart can help with swelling and pain control.  Icing in a pulsatile fashion, such as 20 minutes on and 20 minutes off, can be followed.    Do not place ice directly on skin. Make sure there is a barrier between  to skin and the ice pack.    Using frozen items such as frozen peas works well as the conform nicely to the are that needs to be iced.  USE AN ACE WRAP OR TED HOSE FOR SWELLING CONTROL  In addition to icing and elevation, Ace wraps or TED hose are  used to help limit and resolve swelling.  It is recommended to use Ace wraps or TED hose until you are informed to stop.    When using Ace Wraps start the wrapping distally (farthest away from the body) and wrap proximally (closer to the body)   Example: If you had surgery on your leg or thing and you do not have a splint on, start the ace wrap at the toes and work your way up to the thigh        If you had surgery on your upper extremity and do not have a splint on, start the ace wrap at your fingers and work your way up to the upper arm  IF YOU ARE IN A CAM BOOT (BLACK BOOT)  You may remove boot periodically. Perform daily dressing changes as noted below.  Wash the liner of the boot regularly and wear a sock when wearing the boot. It is recommended that you sleep in the boot until told otherwise   CALL THE OFFICE WITH ANY QUESTIONS OR CONCERNS: [phone]   VISIT OUR WEBSITE FOR ADDITIONAL INFORMATION: [email or web address]     Discharge Wound Care Instructions  Do NOT apply any ointments, solutions or lotions to pin sites or surgical wounds.  These prevent needed drainage and even though solutions like hydrogen peroxide kill bacteria, they also damage cells lining the pin sites that help fight infection.  Applying lotions or ointments can keep the wounds moist and can cause them to breakdown and open up as well. This can increase the risk for infection. When in doubt call the office.  Surgical incisions should be dressed daily.  If any drainage is noted, use one layer of adaptic, then gauze, Kerlix, and an ace wrap.  Once the incision is completely dry and without drainage, it may be left open to air out.  Showering may begin 36-48 hours later.  Cleaning gently with soap and water.  Traumatic wounds should be dressed daily as well.    One layer of adaptic, gauze, Kerlix, then ace wrap.  The adaptic can be discontinued once the draining has ceased    If you have a wet to dry dressing:  wet the gauze with saline the squeeze as much saline out so the gauze is moist (not soaking wet), place moistened gauze over wound, then place a dry gauze over the moist one, followed by Kerlix wrap, then ace wrap.

## 2020-04-12 NOTE — TOC CAGE-AID Note (Signed)
Transition of Care [facility]) - CAGE-AID Screening   Patient Details  Name: Corey Burton MRN: [id number] Date of Birth: Oct 08, 1990  [facility] of [facility]) CM/SW Contact:    Jimmy Picket, LCSWA Phone Number: 04/12/2020, 2:35 PM   Clinical Narrative: CSW spoke with pt about alcohol and substance use. Pt reports social alcohol use of a couple times a month. Pt denies substance use. Pt did not need resources at this time.    CAGE-AID Screening:    Have You Ever Felt You Ought to Cut Down on Your Drinking or Drug Use?: No Have People Annoyed You By Critizing Your Drinking Or Drug Use?: No Have You Felt Bad Or Guilty About Your Drinking Or Drug Use?: No Have You Ever Had a Drink or Used Drugs First Thing In The Morning to Steady Your Nerves or to Get Rid of a Hangover?: No CAGE-AID Score: 0  Substance Abuse Education Offered: Yes     Denita Lung, Bridget Hartshorn Clinical Social Worker [phone]

## 2020-04-12 NOTE — ED Notes (Addendum)
Pt continues to ask for PO fluids/food. Pt informed 3-4 times that he can not have anything as this time as he is scheduled for surgery in the morning. Officers at bedside informed.

## 2020-04-12 NOTE — ED Notes (Signed)
Called [facility] in [facility] for RN, Fredric Mare.

## 2020-04-12 NOTE — Anesthesia Postprocedure Evaluation (Signed)
Anesthesia Post Note  Patient: Corey Burton  Procedure(s) Performed: INTRAMEDULLARY (IM) NAIL TIBIAL (Right )     Patient location during evaluation: PACU Anesthesia Type: General Level of consciousness: awake and alert Pain management: pain level controlled Vital Signs Assessment: post-procedure vital signs reviewed and stable Respiratory status: spontaneous breathing, nonlabored ventilation and respiratory function stable Cardiovascular status: blood pressure returned to baseline and stable Postop Assessment: no apparent nausea or vomiting Anesthetic complications: no   No complications documented.  Last Vitals:  Vitals:   04/12/20 1250 04/12/20 1338  BP: (!) 148/104 (!) 160/97  Pulse: 89 84  Resp: 18 15  Temp: 36.7 C 36.5 C  SpO2: 97% 97%    Last Pain:  Vitals:   04/12/20 1338  TempSrc: Oral  PainSc:                  Lucretia Kern
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
## 2020-04-12 NOTE — Interval H&P Note (Signed)
History and Physical Interval Note:  04/12/2020 10:05 AM  Corey Burton  has presented today for surgery, with the diagnosis of RIGHT TIBIA FRACTURE.  The various methods of treatment have been discussed with the patient and family. After consideration of risks, benefits and other options for treatment, the patient has consented to  Procedure(s): INTRAMEDULLARY (IM) NAIL TIBIAL (Right) as a surgical intervention.  The patient's history has been reviewed, patient examined, no change in status, stable for surgery.  I have reviewed the patient's chart and labs.  Questions were answered to the patient's satisfaction.     [name] P Arrick Dutton

## 2020-04-12 NOTE — Progress Notes (Signed)
Orthopedic Tech Progress Note Patient Details:  Corey Burton [birth year] [id number] Left bedside and instructed RN how to apply Ortho Devices Type of Ortho Device: CAM walker Ortho Device/Splint Location: RLE Ortho Device/Splint Interventions: Ordered   Post Interventions Patient Tolerated: Well   [name] [name] 04/12/2020, 2:14 PM

## 2020-04-12 NOTE — ED Notes (Signed)
Spoke with Dr. Jena Gauss concerning pt's blood pressure drop. Per MD begin 3rd bolus. MD to be paged if MAP less than or equal to 50, HR >110-120. Or pt becomes symptomatic.

## 2020-04-12 NOTE — Anesthesia Procedure Notes (Signed)
Procedure Name: Intubation Date/Time: 04/12/2020 10:40 AM Performed by: Marny Lowenstein, CRNA Pre-anesthesia Checklist: Patient identified, Emergency Drugs available, Suction available and Patient being monitored Patient Re-evaluated:Patient Re-evaluated prior to induction Oxygen Delivery Method: Circle system utilized Preoxygenation: Pre-oxygenation with 100% oxygen Induction Type: IV induction, Rapid sequence and Cricoid Pressure applied Laryngoscope Size: Miller and 3 Grade View: Grade I Tube type: Oral Tube size: 7.5 mm Number of attempts: 1 Airway Equipment and Method: Patient positioned with wedge pillow and Stylet Placement Confirmation: ETT inserted through vocal cords under direct vision,  positive ETCO2 and breath sounds checked- equal and bilateral Secured at: 24 cm Tube secured with: Tape Dental Injury: Teeth and Oropharynx as per pre-operative assessment

## 2020-04-13 ENCOUNTER — Encounter (HOSPITAL_COMMUNITY): Payer: Self-pay | Admitting: Student

## 2020-04-13 LAB — CBC
HCT: 28.5 % — ABNORMAL LOW (ref 39.0–52.0)
Hemoglobin: 9.7 g/dL — ABNORMAL LOW (ref 13.0–17.0)
MCH: 29.8 pg (ref 26.0–34.0)
MCHC: 34 g/dL (ref 30.0–36.0)
MCV: 87.7 fL (ref 80.0–100.0)
Platelets: 186 10*3/uL (ref 150–400)
RBC: 3.25 MIL/uL — ABNORMAL LOW (ref 4.22–5.81)
RDW: 13.5 % (ref 11.5–15.5)
WBC: 16.6 10*3/uL — ABNORMAL HIGH (ref 4.0–10.5)
nRBC: 0 % (ref 0.0–0.2)

## 2020-04-13 MED ORDER — HALOPERIDOL LACTATE 5 MG/ML IJ SOLN
5.0000 mg | Freq: Once | INTRAMUSCULAR | Status: AC
  Administered 2020-04-14: 5 mg via INTRAVENOUS
  Filled 2020-04-13: qty 1

## 2020-04-13 MED ORDER — ARIPIPRAZOLE 2 MG PO TABS
20.0000 mg | ORAL_TABLET | Freq: Every day | ORAL | Status: DC
  Filled 2020-04-13: qty 10
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
## 2020-04-14 LAB — CBC
HCT: 27.9 % — ABNORMAL LOW (ref 39.0–52.0)
Hemoglobin: 9.2 g/dL — ABNORMAL LOW (ref 13.0–17.0)
MCH: 28.8 pg (ref 26.0–34.0)
MCHC: 33 g/dL (ref 30.0–36.0)
MCV: 87.5 fL (ref 80.0–100.0)
Platelets: 183 10*3/uL (ref 150–400)
RBC: 3.19 MIL/uL — ABNORMAL LOW (ref 4.22–5.81)
RDW: 13.7 % (ref 11.5–15.5)
WBC: 14.9 10*3/uL — ABNORMAL HIGH (ref 4.0–10.5)
nRBC: 0 % (ref 0.0–0.2)

## 2020-04-14 MED ORDER — ARIPIPRAZOLE 10 MG PO TABS
20.0000 mg | ORAL_TABLET | Freq: Every day | ORAL | Status: DC
  Administered 2020-04-14 – 2020-04-18 (×5): 20 mg via ORAL
  Filled 2020-04-14 (×5): qty 2

## 2020-04-14 NOTE — Progress Notes (Signed)
Since I came on pt has been asking for something to stop the voices in his head and thumping going on in his feet- administered pain and muscle relaxer in attempt to aleviate symptoms - noted he is not on any antipsycotics home med listed as abilify 20mg  daily. He is also voicing paranoia statements about being followed, experimented on, being profiled- talking about the christians and scientists creating their own god. Thumping in his leg like a second heart beat. Called on call PA for sports med- obtained order for the abilify also a x1 dose of haldol to take edge off'
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
## 2020-04-15 DIAGNOSIS — F209 Schizophrenia, unspecified: Secondary | ICD-10-CM

## 2020-04-15 LAB — CBC
HCT: 26.6 % — ABNORMAL LOW (ref 39.0–52.0)
Hemoglobin: 8.7 g/dL — ABNORMAL LOW (ref 13.0–17.0)
MCH: 28.7 pg (ref 26.0–34.0)
MCHC: 32.7 g/dL (ref 30.0–36.0)
MCV: 87.8 fL (ref 80.0–100.0)
Platelets: 229 10*3/uL (ref 150–400)
RBC: 3.03 MIL/uL — ABNORMAL LOW (ref 4.22–5.81)
RDW: 13.5 % (ref 11.5–15.5)
WBC: 16.5 10*3/uL — ABNORMAL HIGH (ref 4.0–10.5)
nRBC: 0 % (ref 0.0–0.2)

## 2020-04-15 MED ORDER — HALOPERIDOL LACTATE 5 MG/ML IJ SOLN
5.0000 mg | Freq: Four times a day (QID) | INTRAMUSCULAR | Status: DC | PRN
  Administered 2020-04-15: 5 mg via INTRAVENOUS
  Filled 2020-04-15: qty 1
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
## 2020-04-15 NOTE — Progress Notes (Signed)
continues to make paranoid delusional comites about something being injected into his spine causing people to remotely control his body movements. Still hearing the voices

## 2020-04-15 NOTE — Progress Notes (Signed)
1315 Pt is verbalizing that he hears some voices. "Stomping my body". He is also asking how to revoke the effect of the injection from his spine. He agreed for me to give him a relaxer. Haldol was given.
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
## 2020-04-16 ENCOUNTER — Encounter (HOSPITAL_COMMUNITY): Payer: Self-pay | Admitting: Student

## 2020-04-16 LAB — BASIC METABOLIC PANEL
Anion gap: 13 (ref 5–15)
BUN: 6 mg/dL (ref 6–20)
CO2: 21 mmol/L — ABNORMAL LOW (ref 22–32)
Calcium: 8.6 mg/dL — ABNORMAL LOW (ref 8.9–10.3)
Chloride: 104 mmol/L (ref 98–111)
Creatinine, Ser: 1.11 mg/dL (ref 0.61–1.24)
GFR, Estimated: >60 mL/min (ref >60)
Glucose, Bld: 153 mg/dL — ABNORMAL HIGH (ref 70–99)
Potassium: 3.4 mmol/L — ABNORMAL LOW (ref 3.5–5.1)
Sodium: 138 mmol/L (ref 135–145)

## 2020-04-16 LAB — CBC
HCT: 26.9 % — ABNORMAL LOW (ref 39.0–52.0)
Hemoglobin: 9 g/dL — ABNORMAL LOW (ref 13.0–17.0)
MCH: 29.1 pg (ref 26.0–34.0)
MCHC: 33.5 g/dL (ref 30.0–36.0)
MCV: 87.1 fL (ref 80.0–100.0)
Platelets: 310 10*3/uL (ref 150–400)
RBC: 3.09 MIL/uL — ABNORMAL LOW (ref 4.22–5.81)
RDW: 13.3 % (ref 11.5–15.5)
WBC: 17.4 10*3/uL — ABNORMAL HIGH (ref 4.0–10.5)
nRBC: 0 % (ref 0.0–0.2)

## 2020-04-16 LAB — GLUCOSE, CAPILLARY: Glucose-Capillary: 142 mg/dL — ABNORMAL HIGH (ref 70–99)

## 2020-04-16 MED ORDER — BISACODYL 5 MG PO TBEC: 5.0000 mg | DELAYED_RELEASE_TABLET | Freq: Every day | ORAL | Status: DC | PRN

## 2020-04-16 MED ORDER — VITAMIN D3 25 MCG PO TABS: 1000.0000 [IU] | ORAL_TABLET | Freq: Every day | ORAL | 1 refills | Status: AC

## 2020-04-16 MED ORDER — POLYETHYLENE GLYCOL 3350 17 G PO PACK
17.0000 g | PACK | Freq: Every day | ORAL | Status: DC
  Administered 2020-04-16 – 2020-04-18 (×2): 17 g via ORAL
  Filled 2020-04-16 (×2): qty 1

## 2020-04-16 MED ORDER — VITAMIN D 25 MCG (1000 UNIT) PO TABS
1000.0000 [IU] | ORAL_TABLET | Freq: Every day | ORAL | Status: DC
  Administered 2020-04-16 – 2020-04-18 (×3): 1000 [IU] via ORAL
  Filled 2020-04-16 (×3): qty 1
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
## 2020-04-16 NOTE — Significant Event (Signed)
Rapid Response Event Note   Reason for Call :  Syncopal episode  Per staff: He was standing at the sink washing up when he began to complain of lightheadness and became weak.  He was not responding to staff for about 1 min.  Initial Focused Assessment:  Upon my arrival patient being assisted to bed with max support.  Placed flat in the bed.  He began to become more responsive.  BP 129/75  HR 87  RR 22  O2 sat 99% on RA.  Placed on O2 while minimally responsive.  Once he was awake, able to remove O2.    He returned to his normal neurological baseline.  No focal deficits noted  Interventions:  22 ga PIV placed by RN 12 lead EKG done CBC/Bmet drawn  Plan of Care:  RN to call if patient has additional syncopal episodes or if he become hypotensive   Event Summary:   MD Notified: Dr Jena Gauss Call Time: 1454 Arrival Time: 1457 End Time: 1530  Marcellina Millin, RN
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
## 2020-04-17 ENCOUNTER — Inpatient Hospital Stay (HOSPITAL_COMMUNITY): Payer: Medicare Other

## 2020-04-17 LAB — BASIC METABOLIC PANEL
Anion gap: 11 (ref 5–15)
BUN: 6 mg/dL (ref 6–20)
CO2: 22 mmol/L (ref 22–32)
Calcium: 8.8 mg/dL — ABNORMAL LOW (ref 8.9–10.3)
Chloride: 106 mmol/L (ref 98–111)
Creatinine, Ser: 1.02 mg/dL (ref 0.61–1.24)
GFR, Estimated: >60 mL/min (ref >60)
Glucose, Bld: 109 mg/dL — ABNORMAL HIGH (ref 70–99)
Potassium: 3.6 mmol/L (ref 3.5–5.1)
Sodium: 139 mmol/L (ref 135–145)

## 2020-04-17 LAB — CBC
HCT: 27 % — ABNORMAL LOW (ref 39.0–52.0)
Hemoglobin: 8.7 g/dL — ABNORMAL LOW (ref 13.0–17.0)
MCH: 28.7 pg (ref 26.0–34.0)
MCHC: 32.2 g/dL (ref 30.0–36.0)
MCV: 89.1 fL (ref 80.0–100.0)
Platelets: 327 10*3/uL (ref 150–400)
RBC: 3.03 MIL/uL — ABNORMAL LOW (ref 4.22–5.81)
RDW: 13.2 % (ref 11.5–15.5)
WBC: 17.6 10*3/uL — ABNORMAL HIGH (ref 4.0–10.5)
nRBC: 0 % (ref 0.0–0.2)

## 2020-04-17 MED ORDER — HYDRALAZINE HCL 10 MG PO TABS: 10.0000 mg | ORAL_TABLET | Freq: Four times a day (QID) | ORAL | Status: DC | PRN

## 2020-04-17 NOTE — Progress Notes (Signed)
   04/17/20 1000  Clinical Encounter Type  Visited With Patient  Visit Type Spiritual support  Referral From Nurse  Consult/Referral To Chaplain  Spiritual Encounters  Spiritual Needs Emotional  Chaplain responded to page from nurse to visit Corey Burton. When I entered he was very pleasant and smiled.  He was a little drowsy but we were able to talk briefly.  He asked for a Qur'an and I explained we have one available and I will bring it back to him.    Chaplain Linsey Arteaga Morgan-Simpson [phone]

## 2020-04-17 NOTE — Plan of Care (Signed)
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
## 2020-04-18 ENCOUNTER — Inpatient Hospital Stay (HOSPITAL_BASED_OUTPATIENT_CLINIC_OR_DEPARTMENT_OTHER): Payer: Medicare Other

## 2020-04-18 DIAGNOSIS — M7989 Other specified soft tissue disorders: Secondary | ICD-10-CM

## 2020-04-18 DIAGNOSIS — S82101A Unspecified fracture of upper end of right tibia, initial encounter for closed fracture: Secondary | ICD-10-CM

## 2020-04-18 LAB — CREATININE, SERUM
Creatinine, Ser: 0.85 mg/dL (ref 0.61–1.24)
GFR, Estimated: >60 mL/min (ref >60)

## 2020-04-18 MED ORDER — ARIPIPRAZOLE 20 MG PO TABS: 20.0000 mg | ORAL_TABLET | Freq: Every day | ORAL | 0 refills | Status: DC

## 2020-04-18 MED ORDER — ARIPIPRAZOLE 20 MG PO TABS: 20.0000 mg | ORAL_TABLET | Freq: Every day | ORAL | 0 refills | Status: AC

## 2020-04-18 NOTE — Progress Notes (Signed)
Lower extremity venous bilateral study completed.  Preliminary results relayed to RN.   See CV Proc for preliminary results report.   Katria Botts, RDMS  
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
